# Patient Record
Sex: Male | Born: 1945 | Race: White | Hispanic: No | Marital: Married | Smoking: Never smoker
Health system: Southern US, Community
[De-identification: ages and names within clinical notes are randomized; demographics above are authoritative.]

## PROBLEM LIST (undated history)

## (undated) DIAGNOSIS — I10 Essential (primary) hypertension: Secondary | ICD-10-CM

## (undated) DIAGNOSIS — C229 Malignant neoplasm of liver, not specified as primary or secondary: Secondary | ICD-10-CM

## (undated) DIAGNOSIS — E785 Hyperlipidemia, unspecified: Secondary | ICD-10-CM

## (undated) DIAGNOSIS — E119 Type 2 diabetes mellitus without complications: Secondary | ICD-10-CM

## (undated) DIAGNOSIS — C801 Malignant (primary) neoplasm, unspecified: Secondary | ICD-10-CM

## (undated) HISTORY — DX: Malignant (primary) neoplasm, unspecified: C80.1

## (undated) HISTORY — DX: Hyperlipidemia, unspecified: E78.5

## (undated) HISTORY — PX: PROSTATE SURGERY: SHX751

## (undated) HISTORY — DX: Essential (primary) hypertension: I10

## (undated) HISTORY — DX: Malignant neoplasm of liver, not specified as primary or secondary: C22.9

## (undated) HISTORY — PX: EYE SURGERY: SHX253

## (undated) HISTORY — DX: Type 2 diabetes mellitus without complications: E11.9

## (undated) HISTORY — PX: LIVER BIOPSY: SHX301

---

## 2013-12-29 DIAGNOSIS — E559 Vitamin D deficiency, unspecified: Secondary | ICD-10-CM | POA: Insufficient documentation

## 2014-11-01 LAB — LIPID PANEL
LDL Cholesterol: 74 mg/dL
Triglycerides: 423 mg/dL — AB (ref 40–160)

## 2014-12-10 ENCOUNTER — Ambulatory Visit (INDEPENDENT_AMBULATORY_CARE_PROVIDER_SITE_OTHER): Payer: Medicare Other | Admitting: Emergency Medicine

## 2014-12-10 VITALS — BP 170/90 | HR 95 | Resp 16 | Ht 72.0 in | Wt 214.0 lb

## 2014-12-10 DIAGNOSIS — S01511A Laceration without foreign body of lip, initial encounter: Secondary | ICD-10-CM

## 2014-12-10 DIAGNOSIS — Z23 Encounter for immunization: Secondary | ICD-10-CM

## 2014-12-10 NOTE — Progress Notes (Signed)
[facility] [address], [place] [address] [phone]  Date:  12/10/2014   Name:  Victor Harrell   DOB:  [birth year]   MRN:  [id number]  PCP:  Raelene Bott, MD    Chief Complaint: Lip Laceration   History of Present Illness:  Victor Harrell is a 69 y.o. 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  Struck him in the lower lip Has a laceration with an apparent loss of skin and soft tissue Not current on TD Denies other complaint or health concern today.   There are no active problems to display for this patient.   Past Medical History  Diagnosis Date  . Cancer   . Diabetes mellitus without complication   . Hypertension     Past Surgical History  Procedure Laterality Date  . Eye surgery    . Prostate surgery      History  Substance Use Topics  . Smoking status: Never Smoker   . Smokeless tobacco: Not on file  . Alcohol Use: Not on file    History reviewed. No pertinent family history.  Not on File  Medication list has been reviewed and updated.  No current outpatient prescriptions on file prior to visit.   No current facility-administered medications on file prior to visit.    Review of Systems:  As per HPI, otherwise negative.    Physical Examination: Filed Vitals:   12/10/14 1250  BP: 170/90  Pulse: 95  Resp: 16   Filed Vitals:   12/10/14 1250  Height: 6' (1.829 m)  Weight: 214 lb (97.07 kg)   Body mass index is 29.02 kg/(m^2). Ideal Body Weight: Weight in (lb) to have BMI = 25: 183.9   GEN: WDWN, NAD, Non-toxic, Alert & Oriented x 3 HEENT: laceration lower lip 3 cm transverse with loss of soft tissue and skin.  Dentition and occlusion intact, Normocephalic.  Ears and Nose: No external deformity. EXTR: No clubbing/cyanosis/edema NEURO: Normal gait.  PSYCH: Normally interactive. Conversant. Not depressed or anxious appearing.  Calm demeanor.     Assessment and Plan: Complex laceration lip. Plastics TDAP  Signed,  Ellison Carwin, MD

## 2014-12-28 DIAGNOSIS — C61 Malignant neoplasm of prostate: Secondary | ICD-10-CM | POA: Insufficient documentation

## 2015-01-14 LAB — HM DIABETES EYE EXAM

## 2015-03-22 LAB — HEMOGLOBIN A1C: HEMOGLOBIN A1C: 9.5 % — AB (ref 4.0–6.0)

## 2015-04-12 ENCOUNTER — Encounter: Payer: Self-pay | Admitting: Endocrinology

## 2015-04-12 ENCOUNTER — Ambulatory Visit (INDEPENDENT_AMBULATORY_CARE_PROVIDER_SITE_OTHER): Payer: Medicare Other | Admitting: Endocrinology

## 2015-04-12 ENCOUNTER — Encounter: Payer: Medicare Other | Attending: Endocrinology | Admitting: Dietician

## 2015-04-12 ENCOUNTER — Encounter: Payer: Self-pay | Admitting: Dietician

## 2015-04-12 VITALS — Ht 71.0 in | Wt 211.0 lb

## 2015-04-12 VITALS — BP 160/80 | HR 77 | Temp 97.9°F | Resp 16 | Ht 72.0 in | Wt 211.8 lb

## 2015-04-12 DIAGNOSIS — E1165 Type 2 diabetes mellitus with hyperglycemia: Secondary | ICD-10-CM

## 2015-04-12 DIAGNOSIS — Z713 Dietary counseling and surveillance: Secondary | ICD-10-CM | POA: Insufficient documentation

## 2015-04-12 DIAGNOSIS — E785 Hyperlipidemia, unspecified: Secondary | ICD-10-CM

## 2015-04-12 DIAGNOSIS — E041 Nontoxic single thyroid nodule: Secondary | ICD-10-CM | POA: Diagnosis not present

## 2015-04-12 DIAGNOSIS — IMO0002 Reserved for concepts with insufficient information to code with codable children: Secondary | ICD-10-CM

## 2015-04-12 DIAGNOSIS — I10 Essential (primary) hypertension: Secondary | ICD-10-CM | POA: Diagnosis not present

## 2015-04-12 DIAGNOSIS — Z794 Long term (current) use of insulin: Secondary | ICD-10-CM | POA: Diagnosis not present

## 2015-04-12 LAB — POCT GLUCOSE (DEVICE FOR HOME USE): Glucose Fasting, POC: 296 mg/dL — AB (ref 70–99)

## 2015-04-12 LAB — MICROALBUMIN / CREATININE URINE RATIO
Creatinine,U: 55 mg/dL
Microalb Creat Ratio: 6.2 mg/g (ref 0.0–30.0)
Microalb, Ur: 3.4 mg/dL — ABNORMAL HIGH (ref 0.0–1.9)

## 2015-04-12 NOTE — Progress Notes (Signed)
Patient ID: Victor Harrell, male   DOB: 02-02-1946, 69 y.o.   MRN: [id number]           Reason for Appointment: Consultation for Type 2 Diabetes  Referring physician: None  History of Present Illness:          Date of diagnosis of type 2 diabetes mellitus : 47 + yrs       Background history:  He had been using oral agents like metformin and Amaryl for several years He is not sure if his sugars were controlled in the first several years but he does not think his blood sugar has been controlled for some time Victoza has been used for the last 2 years in addition and not sure if he has benefited from this.  He does not think it reduces his portion size or appetite He has lost some weight over the last few years  When he was started on treatment for his prostate cancer his blood sugars started going up especially with starting 10 mg prednisone; his A1c went up to 13.1 in 2/15, previously 9.6 in 2014 With this he was started on insulin and initially he took Lantus 10 units which was gradually increased Also started on Humalog, initially on a sliding scale  Recent history:   INSULIN regimen is described as:  28 Lantus at bedtime, Humalog 8 units twice a day      Because of persistently high blood sugars and A1c mostly over 9% he has come in today for further management 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  No recent weight loss  Current blood sugar patterns and problems identified:  His blood sugars are mildly increased fasting  He checks his blood sugars at suppertime  also and these are in the low 200s fairly consistently  Does not check any blood sugar at lunch or bedtime  He is taking his Humalog randomly about 30-45 minutes before meals at breakfast and supper  He says he is eating snacks throughout the day in addition to his meals     He takes 5 mg prednisone at breakfast and this may be causing higher readings Today he forgot his Humalog before breakfast although normally he takes this regularly and his glucose is 296  Oral hypoglycemic drugs the patient is taking are: Amaryl, metformin 1 g twice a day      Side effects from medications have been: None  Compliance with the medical regimen: Fair Hypoglycemia: None    Glucose monitoring:  done about 2 times a day         Glucometer: ?  .      Blood Glucose readings by time of day from home record  PREMEAL Breakfast Lunch Dinner Bedtime  Overall   Glucose range: 138-162   200+     Median:        Self-care: The diet that the patient has been following SN:[id number].    Typical meal intake: Breakfast is eggs and grits.  Usually avoiding drinks with sugar, drinking some milk when thirsty  Dietician visit, most recent: At diagnosis only               Exercise: none   Weight history: Previous maximum 248, recently stable  Wt Readings from Last 3 Encounters:  04/12/15 211 lb (95.709 kg)  04/12/15 211 lb 12.8 oz (96.072 kg)  12/10/14 214 lb (97.07 kg)    Glycemic control:   Lab Results  Component Value Date   HGBA1C 9.5* 03/22/2015   Lab Results  Component Value Date   LDLCALC 74 11/01/2014   Urine microalbumin ration normal in mid 2015      Medication List       This list is accurate as of: 04/12/15 12:12 PM.  Always use your most recent med list.               abiraterone Acetate 250 MG tablet  Commonly known as:  ZYTIGA  Take 1,000 mg by mouth daily. Take on an empty stomach 1 hour before or 2 hours after a meal     Amlodipine-Valsartan-HCTZ 5-160-12.5 MG Tabs    Take by mouth.     aspirin 81 MG tablet  Take 81 mg by mouth daily.     denosumab 120 MG/1.7ML Soln injection  Commonly known as:  XGEVA  Inject 120 mg into the skin every 30 (thirty) days.     glimepiride 4 MG tablet  Commonly known as:  AMARYL  Take 4 mg by mouth daily with breakfast.     Insulin Glargine 100 UNIT/ML Solostar Pen  Commonly known as:  LANTUS  Inject 28 Units into the skin daily at 10 pm.     insulin lispro 100 UNIT/ML injection  Commonly known as:  HUMALOG  Inject 8 Units into the skin 2 (two) times daily.     leuprolide 11.25 MG injection  Commonly known as:  LUPRON  Inject 11.25 mg into the muscle every 3 (three) months.     Liraglutide 18 MG/3ML Sopn  Inject 1.8 mg into the skin.     metFORMIN 1000 MG tablet  Commonly known as:  GLUCOPHAGE  Take 1,000 mg by mouth 2 (two) times daily with a meal.     predniSONE 5 MG tablet  Commonly known as:  DELTASONE  Take 5 mg by mouth daily with breakfast.     simvastatin 40 MG tablet  Commonly known as:  ZOCOR  Take 40 mg by mouth daily.     SM CINNAMON PO  Take by mouth.     Vitamin D3 5000 UNITS Caps  Take by mouth.        Allergies: No Known Allergies  Past Medical History  Diagnosis Date  . Diabetes mellitus without complication   . Hypertension   . Cancer     Prostate  . Hyperlipidemia     Past Surgical History  Procedure Laterality Date  . Eye surgery    . Prostate surgery      Family History  Problem Relation Age of Onset  . Diabetes Father     Social History:  reports that he has never smoked. He does not have any smokeless tobacco history on file. His alcohol and drug histories are not on file.    Review of Systems    Lipid history: He has been on Zocor for several years with good control of LDL but has had high triglycerides, previously 600+    Lab Results  Component Value Date   LDLCALC 74 11/01/2014   TRIG 423*  11/01/2014           Constitutional: no recent  weight gain/loss, no complaints of unusual fatigue   Eyes: no history of blurred vision.  Most recent eye exam was in 2/16 and did not show retinopathy  ENT: no  difficulty swallowing, no hoarseness.   Cardiovascular: no chest pain or tightness on exertion.  No leg swelling.  Hypertension: He has been taking amlodipine/valsartan for treatment  Respiratory: no cough/shortness of breath  Gastrointestinal: no change in bowel habits, nausea or abdominal pain  Musculoskeletal: no muscle/joint aches He has been taking vitamin D supplements for deficiency   Urological:   No excessive frequency of urination; he has nocturia once.  Has had incontinence since his prostate surgery Also has had erectile dysfunction  Skin: no rash or infections  Neurological: no headaches.  Has no numbness, burning, pains or tingling in feet    Psychiatric: no symptoms of depression  Endocrine: No unusual fatigue, cold intolerance or history of thyroid disease   LABS:  Office Visit on 04/12/2015  Component Date Value Ref Range Status  . Hgb A1c MFr Bld 03/22/2015 9.5* 4.0 - 6.0 % Final  . HM Diabetic Eye Exam 01/14/2015 No Retinopathy  No Retinopathy Final  . Glucose Fasting, POC 04/12/2015 296* 70 - 99 mg/dL Final  . Triglycerides 11/01/2014 423* 40 - 160 mg/dL Final  . LDL Cholesterol 11/01/2014 74   Final    Physical Examination:  BP 160/80 mmHg  Pulse 77  Temp(Src) 97.9 F (36.6 C)  Resp 16  Ht 6' (1.829 m)  Wt 211 lb 12.8 oz (96.072 kg)  BMI 28.72 kg/m2  SpO2 97%  GENERAL:         Patient has mild obesity.  has significant abdominal obesity  HEENT:         Eye exam shows normal external appearance. Fundus exam shows no retinopathy. Oral exam shows normal mucosa .  NECK:   There is no lymphadenopathy Thyroid is not enlarged but a 1.5-2 cm left thyroid nodule is felt, firm and smooth.  Right side is not palpable Carotids are normal to palpation and no bruit heard LUNGS:         Chest is  symmetrical. Lungs are clear to auscultation.Marland Kitchen   HEART:         Heart sounds:  S1 and S2 are normal. No murmurs or clicks heard., no S3 or S4.   ABDOMEN:   There is no distention present. Liver and spleen are not palpable. No other mass or tenderness present.   NEUROLOGICAL:   Vibration sense is moderately reduced in distal first toes. Ankle jerks are absent bilaterally.          Diabetic foot exam shows normal monofilament sensation in the toes and plantar surfaces, no skin lesions or ulcers on the feet and normal pedal pulses MUSCULOSKELETAL:  There is no swelling or deformity of the peripheral joints. Spine is normal to inspection.   EXTREMITIES:     There is no edema. No skin lesions present. SKIN:       No rash or lesions of concern.        ASSESSMENT:  Diabetes type 2, uncontrolled    He has had long-standing diabetes, recently requiring insulin Available records indicate that he has had poorly controlled diabetes since at least 2014 and most likely has been insulin deficient for quite some time Also has been inadequately motivated to take care of his diabetes or other aspects of  self-care Insulin regimen: Currently on bedtime Lantus and Humalog at breakfast and supper only with inadequate control and blood sugars are appearing to be consistently high in the afternoons either from lack of insulin coverage for his lunch or inadequate duration of action of Lantus; also may have some effect of prednisone taken the morning  Complications: None evident but needs to have urine microalbumin assessed  HYPERTENSION: Appears not well controlled with blood pressure high today, this has been followed by PCP and he will continue to follow-up there  HYPERLIPIDEMIA: LDL is adequately controlled but triglycerides have been high, partly from his hyperglycemia  Prostate cancer, currently being managed aggressively with multiple agents  THYROID nodule: He has a small nodule on the left side which has not  been previously evaluated.  Will need to evaluate this further with ultrasound Will also recheck TSH level  PLAN:   He will see the dietitian today for detailed meal planning  Check urine microalbumin  Start checking blood sugars after meals at different times by rotation as discussed  Change Lantus to the morning  Start taking Humalog consistently about 10-15 minutes before each meal  He will start taking at least 6 units of Humalog before lunch  He will check his blood sugars as directed at various times and fax the readings in one week to help adjust his Humalog doses  Encouraged him to start walking for exercise to help his insulin resistance and abdominal obesity  May consider changing Lantus to either twice a day or switch to Toujeo if his blood sugars are not consistently controlled in the morning  Stop glimepiride and may consider stopping Victoza as he may not be benefiting from this  Recheck lipids including fasting triglycerides when blood sugars are better  Forms filled out for [employer] for his job   Patient Instructions  Check blood sugars on waking up .Marland Kitchen 4-5 .Marland Kitchen times a week Also check blood sugars about 2-3 hours after a meal and do this after different meals by rotation  Recommended blood sugar levels on waking up is 90-130 and about 2 hours after meal is 140-180 Please bring blood sugar monitor to each visit.  Fax blood sugars in about a week to [phone]  Take humalog 5-10 min before meals.  Continue 8 units at breakfast and supper but start 6 units before lunch also If the blood sugars are consistently over 200 after any given meal may need to go up by 2 units for that meal.  LANTUS: Change to the morning before breakfast, may take with Humalog.  Continue same dose  Start regular walking program, 10-15 minutes a day  May stop GLIMEPIRIDE            Manasi Dishon 04/12/2015, 12:12 PM   Note: This office note was prepared with Merchant navy officer. Any transcriptional errors that result from this process are unintentional.

## 2015-04-12 NOTE — Progress Notes (Signed)
Medical Nutrition Therapy:  Appt start time: 0930 end time:  1000.   Assessment:  Primary concerns today: This is a walk in appointment.  Patient is here with his daughter and would like to improve his blood sugar.  A motivation for change is increased energy.  Patient with hx of type 2 Diabetes for >30 years.  HgbA1C 9.5% 03/22/15.  He states that this has decreased from 14.0%.   Hx also includes HTN and hyperlipidemia.  CBG's 150 in the am and 250 before dinner.  He does not exercise.  Weight was 248 lbs 2 years ago and has been stable at about 211 lbs for the past 2 years.  He reports that he remembers to take his medication almost every day.   Patient lives with wife and daughter.  Patient does most of the shopping.  Wife has a day care in their home and does not cook much.  They often eat out.  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    Preferred Learning Style:   No preference indicated   Learning Readiness:   Contemplating  MEDICATIONS: see list to include vitamin D, amaryl, lantus, humalog, glucophage, prednisone, and zocor   DIETARY INTAKE: Sometime I eat because I am bored.  Reports that he eats few sweets.  All beverages are carb free. 24-hr recall:  B ( AM): 2 eggs, 3 strips bacon, grits, 2 slices white wheat toast, milk or diet coke or coffee with splenda Snk ( AM): NABS and diet coke  L ( PM): chicken or hamburger and unsweetened tea Fast Food Snk ( PM): breakfast bar or NABS or nothing D ( PM):  Usually take out:  hamburger, chicken or BBQ with baked beans, potato salad Snk ( PM): cheerios (plain) with 2% milk Beverages: coffee with splenda, diet coke, unsweetened tea, water  Usual physical activity: none  Estimated energy needs: 2000 calories 225 g carbohydrates 125 g protein 67 g fat  Progress Towards Goal(s):  In progress.   Nutritional Diagnosis:  NB-1.1 Food and nutrition-related knowledge deficit As related to balance of carbohydrates, protein, and fats.  As  evidenced by diet hx.    Intervention:  Nutrition education/counseling regarding meal planning and dietary changes to improve insulin resistance.  Patient's current diet is high fat and snacks even when not hungry.  Discussed changing snacks to healthier options with small amount protein and only eating them if hungry.  Be mindful about eating out and choices made.  Try to prepare simple meals more often at home.  Calorie King app provided to help patient be aware of healthier meal choices when eating out.  Discussed decreasing bacon to occasional.  Discussed the importance of exercise and its role in blood sugar control.  Discussed limiting carbohydrate intake to about 60 grams per meal and 15 grams for snack only when hungry.    Be as active as possible.  Aim for 30 minutes 5 days per week. Bake, broil, grill rather than fried Consider splitting entry with others. Try to have vegetables at every dinner. Dinner ideas:  Quick and easy for home.  Vegetable Soup and [place] sandwich  Pinto beans with brown rice and vegetables  Grilled or baked chicken, baked sweet potato vegetables  1 cup pasta with very lean meat sauce or marinara, salad and steamed veges  Salad with grilled chicken, 2 slices Wheat bread  Brown rice, pinto beans, lettuce, tomato, salsa  Teaching Method Utilized:  Visual Auditory  Handouts given during visit  include:  My plate placemat  Meal plan card  Snack list  Take [place] for calorie king app  Barriers to learning/adherence to lifestyle change: time  Demonstrated degree of understanding via:  Teach Back   Monitoring/Evaluation:  Dietary intake, exercise, and body weight prn.

## 2015-04-12 NOTE — Patient Instructions (Addendum)
Check blood sugars on waking up .Marland Kitchen 4-5 .Marland Kitchen times a week Also check blood sugars about 2-3 hours after a meal and do this after different meals by rotation  Recommended blood sugar levels on waking up is 90-130 and about 2 hours after meal is 140-180 Please bring blood sugar monitor to each visit.  Fax blood sugars in about a week to [phone]  Take humalog 5-10 min before meals.  Continue 8 units at breakfast and supper but start 6 units before lunch also If the blood sugars are consistently over 200 after any given meal may need to go up by 2 units for that meal.  LANTUS: Change to the morning before breakfast, may take with Humalog.  Continue same dose  Start regular walking program, 10-15 minutes a day  May stop GLIMEPIRIDE

## 2015-04-12 NOTE — Patient Instructions (Addendum)
Be as active as possible.  Aim for 30 minutes 5 days per week. Bake, broil, grill rather than fried Consider splitting entry with others. Try to have vegetables at every dinner. Dinner ideas:  Quick and easy for home.  Vegetable Soup and [place] sandwich  Pinto beans with brown rice and vegetables  Grilled or baked chicken, baked sweet potato vegetables  1 cup pasta with very lean meat sauce or marinara, salad and steamed veges  Salad with grilled chicken, 2 slices Wheat bread  Brown rice, pinto beans, lettuce, tomato, salsa

## 2015-05-01 ENCOUNTER — Other Ambulatory Visit: Payer: Medicare Other

## 2015-05-08 ENCOUNTER — Encounter: Payer: Medicare Other | Attending: Endocrinology | Admitting: Nutrition

## 2015-05-08 ENCOUNTER — Encounter: Payer: Self-pay | Admitting: Endocrinology

## 2015-05-08 ENCOUNTER — Ambulatory Visit (INDEPENDENT_AMBULATORY_CARE_PROVIDER_SITE_OTHER): Payer: Medicare Other | Admitting: Endocrinology

## 2015-05-08 VITALS — BP 170/88 | HR 83 | Temp 97.8°F | Resp 16 | Ht 72.0 in | Wt 209.4 lb

## 2015-05-08 DIAGNOSIS — Z713 Dietary counseling and surveillance: Secondary | ICD-10-CM | POA: Diagnosis not present

## 2015-05-08 DIAGNOSIS — IMO0002 Reserved for concepts with insufficient information to code with codable children: Secondary | ICD-10-CM

## 2015-05-08 DIAGNOSIS — E1165 Type 2 diabetes mellitus with hyperglycemia: Secondary | ICD-10-CM

## 2015-05-08 DIAGNOSIS — Z794 Long term (current) use of insulin: Secondary | ICD-10-CM | POA: Insufficient documentation

## 2015-05-08 DIAGNOSIS — I10 Essential (primary) hypertension: Secondary | ICD-10-CM | POA: Diagnosis not present

## 2015-05-08 NOTE — Patient Instructions (Signed)
Fill and apply a new V-go every 24 hours Test blood sugars before meals and at bedtime, and call the results to the office on Monday

## 2015-05-08 NOTE — Progress Notes (Signed)
This patient was instructed on how to fill, apply and use the V-go.  He was given a starter kit of V-go 30s and he filled the V-go without any difficulty.  He did not apply it because he took his Lantus this AM. He was told to stop the Lantus today and to take no more.  Written instructions were given to take 4 clicks (8u) of insulin before all meals.  We discussed the need to adjust this dose by one click, based on the amount of carbs and current blood sugar reading.  He reported good understanding of this.  He had no final questions. He was told to test his blood sugars before meals and at bedtime and to call the readings to Dr. Ronnie Derby office on Monday.  He was given a 800 telephone number to call if questions between now and Monday.  He agreed to do this.

## 2015-05-08 NOTE — Progress Notes (Signed)
Patient ID: Victor Harrell, male   DOB: 1946-09-24, 69 y.o.   MRN: [id number]           Reason for Appointment: Follow-up for Type 2 Diabetes  Referring physician: None  History of Present Illness:          Date of diagnosis of type 2 diabetes mellitus : 71 + yrs       Background history:  He had been using oral agents including metformin and Amaryl for several years He is not sure if his sugars were controlled in the first several years but he does not think his blood sugar has been controlled for some time Victoza has been used since about 2014 in addition and not sure if he has benefited from this.  He does not think it reduces his portion size or appetite He has lost some weight over the last few years  When he was started on treatment for his prostate cancer his blood sugars started going up especially with starting 10 mg prednisone; his A1c went up to 13.1 in 2/15, previously 9.6 in 2014 With this he was started on insulin and initially he took Lantus 10 units which was gradually increased Also started on Humalog, initially on a sliding scale  Recent history:   INSULIN regimen is described as:  28 Lantus at bedtime, Humalog 10 units 3 a day      He was told to change his Lantus in the morning on his initial consultation because of relatively higher readings later in the day Also was told to start taking Humalog with his lunch which he was not doing previously He did bring in his glucose monitor today for review; blood sugars do not appear to be controlled Continues to take Victoza and metformin  Current blood sugar patterns and problems identified:  His blood sugars are consistently increased fasting, appear to be higher than before  He has not checked readings after meals especially after supper; however they seem to be generally higher around lunchtime and suppertime  This is despite his increasing his dose of Humalog to 10 units from 8 units a few days ago  Blood sugars  at suppertime are relatively better but he is checking infrequently He is taking his Humalog about 15 minutes before eating since his last visit  Oral hypoglycemic drugs the patient is taking are:  metformin 1 g twice a day      Side effects from medications have been: None  Compliance with the medical regimen: Fair Hypoglycemia: None    Glucose monitoring:  done about 2 times a day         Glucometer: ?  .      Blood Glucose readings by time of day from home record  Mean values apply above for all meters except median for One Touch  PRE-MEAL Fasting Lunch Dinner Bedtime Overall  Glucose range:  142-229   179-308     Mean/median:    239    197    POST-MEAL PC Breakfast PC Lunch PC Dinner  Glucose range:  197-281     Mean/median:        PREMEAL Breakfast Lunch Dinner Bedtime  Overall   Glucose range: 138-162   200+     Median:        Self-care: The diet that the patient has been following PO:EUMP.    Typical meal intake: Breakfast is eggs and grits.  Usually avoiding drinks with sugar, drinking some milk when thirsty  Dietician visit, most recent: At diagnosis only               Exercise: none   Weight history: Previous maximum 248, recently stable  Wt Readings from Last 3 Encounters:  05/08/15 209 lb 6.4 oz (94.983 kg)  04/12/15 211 lb (95.709 kg)  04/12/15 211 lb 12.8 oz (96.072 kg)    Glycemic control:   Lab Results  Component Value Date   HGBA1C 9.5* 03/22/2015   Lab Results  Component Value Date   MICROALBUR 3.4* 04/12/2015   LDLCALC 74 11/01/2014   Urine microalbumin ration normal in mid 2015      Medication List       This list is accurate as of: 05/08/15  1:00 PM.  Always use your most recent med list.               abiraterone Acetate 250 MG tablet  Commonly known as:  ZYTIGA  Take 1,000 mg by mouth daily. Take on an empty stomach 1 hour before or 2 hours after a meal     Amlodipine-Valsartan-HCTZ 5-160-12.5 MG Tabs  Take by  mouth.     aspirin 81 MG tablet  Take 81 mg by mouth daily.     denosumab 120 MG/1.7ML Soln injection  Commonly known as:  XGEVA  Inject 120 mg into the skin every 30 (thirty) days.     Insulin Glargine 100 UNIT/ML Solostar Pen  Commonly known as:  LANTUS  Inject 28 Units into the skin daily at 10 pm.     insulin lispro 100 UNIT/ML injection  Commonly known as:  HUMALOG  Inject 10 Units into the skin 2 (two) times daily.     leuprolide 11.25 MG injection  Commonly known as:  LUPRON  Inject 11.25 mg into the muscle every 3 (three) months.     Liraglutide 18 MG/3ML Sopn  Inject 1.8 mg into the skin.     metFORMIN 1000 MG tablet  Commonly known as:  GLUCOPHAGE  Take 1,000 mg by mouth 2 (two) times daily with a meal.     predniSONE 5 MG tablet  Commonly known as:  DELTASONE  Take 5 mg by mouth daily with breakfast.     simvastatin 40 MG tablet  Commonly known as:  ZOCOR  Take 40 mg by mouth daily.     SM CINNAMON PO  Take by mouth.     Vitamin D3 5000 UNITS Caps  Take by mouth.        Allergies: No Known Allergies  Past Medical History  Diagnosis Date  . Diabetes mellitus without complication   . Hypertension   . Cancer     Prostate  . Hyperlipidemia     Past Surgical History  Procedure Laterality Date  . Eye surgery    . Prostate surgery      Family History  Problem Relation Age of Onset  . Diabetes Father     Social History:  reports that he has never smoked. He does not have any smokeless tobacco history on file. His alcohol and drug histories are not on file.    Review of Systems    Lipid history: He has been on Zocor for several years with good control of LDL but has had high triglycerides, previously 600+    Lab Results  Component Value Date   LDLCALC 74 11/01/2014   TRIG 423* 11/01/2014          He had a thyroid nodule on  his exam as follows:1.5-2 cm left thyroid nodule but he did not go for an ultrasound as he was told by his  oncologist that his PET scan was normal  Most recent eye exam was in 2/16 and did not show retinopathy  Neurological: last foot exam was in 5/16    LABS:  No visits with results within 1 Week(s) from this visit. Latest known visit with results is:  Office Visit on 04/12/2015  Component Date Value Ref Range Status  . Hgb A1c MFr Bld 03/22/2015 9.5* 4.0 - 6.0 % Final  . HM Diabetic Eye Exam 01/14/2015 No Retinopathy  No Retinopathy Final  . Glucose Fasting, POC 04/12/2015 296* 70 - 99 mg/dL Final  . Microalb, Ur 04/12/2015 3.4* 0.0 - 1.9 mg/dL Final  . Creatinine,U 04/12/2015 55.0   Final  . Microalb Creat Ratio 04/12/2015 6.2  0.0 - 30.0 mg/g Final  . Triglycerides 11/01/2014 423* 40 - 160 mg/dL Final  . LDL Cholesterol 11/01/2014 74   Final    Physical Examination:  BP 170/88 mmHg  Pulse 83  Temp(Src) 97.8 F (36.6 C)  Resp 16  Ht 6' (1.829 m)  Wt 209 lb 6.4 oz (94.983 kg)  BMI 28.39 kg/m2  SpO2 97%        ASSESSMENT:  Diabetes type 2, uncontrolled    He has had long-standing diabetes and requiring insulin See history of present illness for detailed discussion of his current management, blood sugar patterns and problems identified He appears to be somewhat insulin resistant and not responding much to Victoza and metformin He appears to have inadequate duration of action of Lantus as with the morning dosage his fasting blood sugars have gone up Also postprandial readings are mostly high, not checking them regularly recently and none after supper  He is concerned about multiple insulin injections along with Victoza and continued poor control He was shown the V-go pump and described how this will be used and benefits compared to multiple injections and he is interested in this  THYROID nodule: He has a small nodule on the left side.  Will need to evaluate this further with ultrasound Discussed that he needs to have imaging done with an ultrasound and PET scan is not  specific for thyroid disease   PLAN:   He will see the nurse educator to try the V-go pump.  Instructions given, patient information brochures given, insurance verification sent  He will start with a 30 unit basal and use 8 units per meal  Encouraged him to start walking or other exercise program which he has not done  He can stop taking Victoza when current prescription is finished  Recheck lipids including fasting triglycerides when blood sugars are better   Patient Instructions  Lantus 32 units, dont take on am starting V-g0  Check blood sugars on waking up .Marland Kitchen4-5  .Marland Kitchen times a week Also check blood sugars about 2 hours after a meal and do this after different meals by rotation  Recommended blood sugar levels on waking up is 90-130 and about 2 hours after meal is 140-180 Please bring blood sugar monitor to each visit.  Walk in ams or midday   Counseling time on subjects discussed above is over 50% of today's 25 minute visit   Beena Catano 05/08/2015, 1:00 PM   Note: This office note was prepared with Estate agent. Any transcriptional errors that result from this process are unintentional.

## 2015-05-08 NOTE — Patient Instructions (Addendum)
Lantus 32 units, dont take on am starting V-g0  Check blood sugars on waking up .Marland Kitchen4-5  .Marland Kitchen times a week Also check blood sugars about 2 hours after a meal and do this after different meals by rotation  Recommended blood sugar levels on waking up is 90-130 and about 2 hours after meal is 140-180 Please bring blood sugar monitor to each visit.  Walk in ams or midday

## 2015-05-13 ENCOUNTER — Encounter: Payer: Self-pay | Admitting: Endocrinology

## 2015-05-13 ENCOUNTER — Other Ambulatory Visit: Payer: Self-pay

## 2015-05-13 ENCOUNTER — Ambulatory Visit (INDEPENDENT_AMBULATORY_CARE_PROVIDER_SITE_OTHER): Payer: Medicare Other | Admitting: Endocrinology

## 2015-05-13 ENCOUNTER — Other Ambulatory Visit: Payer: Self-pay | Admitting: *Deleted

## 2015-05-13 ENCOUNTER — Ambulatory Visit
Admission: RE | Admit: 2015-05-13 | Discharge: 2015-05-13 | Disposition: A | Discharge status: Home / Self Care | Payer: Medicare Other | Source: Ambulatory Visit | Attending: Endocrinology | Admitting: Endocrinology

## 2015-05-13 VITALS — BP 174/100 | HR 75 | Temp 98.2°F | Resp 16 | Ht 72.0 in | Wt 215.2 lb

## 2015-05-13 DIAGNOSIS — E785 Hyperlipidemia, unspecified: Secondary | ICD-10-CM | POA: Diagnosis not present

## 2015-05-13 DIAGNOSIS — E1165 Type 2 diabetes mellitus with hyperglycemia: Secondary | ICD-10-CM

## 2015-05-13 DIAGNOSIS — IMO0002 Reserved for concepts with insufficient information to code with codable children: Secondary | ICD-10-CM

## 2015-05-13 DIAGNOSIS — E041 Nontoxic single thyroid nodule: Secondary | ICD-10-CM | POA: Diagnosis not present

## 2015-05-13 MED ORDER — INSULIN LISPRO 100 UNIT/ML ~~LOC~~ SOLN: SUBCUTANEOUS | Status: DC

## 2015-05-13 MED ORDER — V-GO 40 KIT: PACK | Status: DC

## 2015-05-13 NOTE — Progress Notes (Signed)
Patient ID: Victor Harrell, male   DOB: [birth year], 69 y.o.   MRN: [id number]           Reason for Appointment: Follow-up for Type 2 Diabetes  Referring physician: None  History of Present Illness:          Date of diagnosis of type 2 diabetes mellitus : 22 + yrs       Background history:  He had been using oral agents including metformin and Amaryl for several years He is not sure if his sugars were controlled in the first several years but he does not think his blood sugar has been controlled for some time Victoza has been used since about 2014 in addition and not sure if he has benefited from this.  He does not think it reduces his portion size or appetite He has lost some weight over the last few years  When he was started on treatment for his prostate cancer his blood sugars started going up especially with starting 10 mg prednisone; his A1c went up to 13.1 in 2/15, previously 9.6 in 2014 With this he was started on insulin and initially he took Lantus 10 units which was gradually increased Also started on Humalog, initially on a sliding scale  Recent history:   INSULIN regimen is described as:  V-go 30 unit pump, boluses 8-12 units with meals    His previous regimen: 28 Lantus at bedtime, Humalog 10 units 3 a day      He was started on the V-go pump on 6/23 instead of the Humalog and Lantus because of poor control   He was also told to leave off his Victoza as he May not have been benefiting from it and was concerned about the cost.  He has been changing his pump in the morning at breakfast time.  Currently  still taking 5 mg prednisone. Continues to take metformin and Amaryl  Current blood sugar patterns and problems identified:  His blood sugars are consistently increased fasting, lowest readings 174 with starting the pump and previously as high as 229    His blood sugars are generally much higher by lunchtime and even without any increase in carbohydrate or fatty intake as  well as some activity his blood sugar may be over 300.   he has only a couple of readings in the afternoons and evenings and these are over 200  Oral hypoglycemic drugs the patient is taking are:  metformin 1 g twice a day , Amaryl      Side effects from medications have been: None  Compliance with the medical regimen: Fair Hypoglycemia: None    Glucose monitoring:  done about 2 times a day         Glucometer: ?  .      Blood Glucose readings by time of day from  [facility] for the last 5 days  Mean values apply above for all meters except median for One Touch  PRE-MEAL Fasting Lunch Dinner Bedtime Overall  Glucose range:  174- 206  284- 378  240    Mean/median:        POST-MEAL PC Breakfast PC Lunch PC Dinner  Glucose range:  280  267  202  Mean/median:         Self-care: The diet that the patient has been following is: small portions    Typical meal intake: Breakfast is eggs and grits or egg McMuffin.  Usually avoiding drinks with sugar, drinking some milk when thirsty  Dietician visit, most recent: At diagnosis only               Exercise: none   Weight history: Previous maximum 248, recently stable  Wt Readings from Last 3 Encounters:  05/13/15 215 lb 3.2 oz (97.614 kg)  05/08/15 209 lb 6.4 oz (94.983 kg)  04/12/15 211 lb (95.709 kg)    Glycemic control:   Lab Results  Component Value Date   HGBA1C 9.5* 03/22/2015   Lab Results  Component Value Date   MICROALBUR 3.4* 04/12/2015   LDLCALC 74 11/01/2014   Urine microalbumin ration normal in mid 2015      Medication List       This list is accurate as of: 05/13/15 10:08 AM.  Always use your most recent med list.               abiraterone Acetate 250 MG tablet  Commonly known as:  ZYTIGA  Take 1,000 mg by mouth daily. Take on an empty stomach 1 hour before or 2 hours after a meal     Amlodipine-Valsartan-HCTZ 5-160-12.5 MG Tabs  Take by mouth.     aspirin 81 MG tablet  Take 81 mg by  mouth daily.     denosumab 120 MG/1.7ML Soln injection  Commonly known as:  XGEVA  Inject 120 mg into the skin every 30 (thirty) days.     Insulin Glargine 100 UNIT/ML Solostar Pen  Commonly known as:  LANTUS  Inject 28 Units into the skin daily at 10 pm.     insulin lispro 100 UNIT/ML injection  Commonly known as:  HUMALOG  Inject 10 Units into the skin 2 (two) times daily.     leuprolide 11.25 MG injection  Commonly known as:  LUPRON  Inject 11.25 mg into the muscle every 3 (three) months.     Liraglutide 18 MG/3ML Sopn  Inject 1.8 mg into the skin.     metFORMIN 1000 MG tablet  Commonly known as:  GLUCOPHAGE  Take 1,000 mg by mouth 2 (two) times daily with a meal.     predniSONE 5 MG tablet  Commonly known as:  DELTASONE  Take 5 mg by mouth daily with breakfast.     simvastatin 40 MG tablet  Commonly known as:  ZOCOR  Take 40 mg by mouth daily.     SM CINNAMON PO  Take by mouth.     Vitamin D3 5000 UNITS Caps  Take by mouth.        Allergies: No Known Allergies  Past Medical History  Diagnosis Date  . Diabetes mellitus without complication   . Hypertension   . Cancer     Prostate  . Hyperlipidemia     Past Surgical History  Procedure Laterality Date  . Eye surgery    . Prostate surgery      Family History  Problem Relation Age of Onset  . Diabetes Father     Social History:  reports that he has never smoked. He does not have any smokeless tobacco history on file. His alcohol and drug histories are not on file.    Review of Systems    Lipid history: He has been on Zocor for several years with good control of LDL but has had high triglycerides, previously 600+    Lab Results  Component Value Date   LDLCALC 74 11/01/2014   TRIG 423* 11/01/2014          He had a thyroid nodule on his  exam as follows:1.5-2 cm left thyroid nodule but he did not go for an ultrasound as he was told by his oncologist that his PET scan was normal  Most recent  eye exam was in 2/16 and did not show retinopathy  Neurological: last foot exam was in 5/16   He is going to get a liver biopsy for an abnormal lesion on PET scan  LABS:  No visits with results within 1 Week(s) from this visit. Latest known visit with results is:  Office Visit on 04/12/2015  Component Date Value Ref Range Status  . Hgb A1c MFr Bld 03/22/2015 9.5* 4.0 - 6.0 % Final  . HM Diabetic Eye Exam 01/14/2015 No Retinopathy  No Retinopathy Final  . Glucose Fasting, POC 04/12/2015 296* 70 - 99 mg/dL Final  . Microalb, Ur 04/12/2015 3.4* 0.0 - 1.9 mg/dL Final  . Creatinine,U 04/12/2015 55.0   Final  . Microalb Creat Ratio 04/12/2015 6.2  0.0 - 30.0 mg/g Final  . Triglycerides 11/01/2014 423* 40 - 160 mg/dL Final  . LDL Cholesterol 11/01/2014 74   Final    Physical Examination:  BP 174/100 mmHg  Pulse 75  Temp(Src) 98.2 F (36.8 C)  Resp 16  Ht 6' (1.829 m)  Wt 215 lb 3.2 oz (97.614 kg)  BMI 29.18 kg/m2  SpO2 97%        ASSESSMENT:  Diabetes type 2, uncontrolled    He has had long-standing diabetes and requiring insulin See history of present illness for detailed discussion of his current management, blood sugar patterns and problems identified He still has high readings with starting the V-go pump , 30 unit basal even though he was taking  His 28 units Lantus dose previously. His blood sugars are also high after meals even with taking up to 12 units bolus.  His blood sugar may be higher with stopping the Victoza. Also taking small amount of prednisone and blood sugars are the highest before lunch.  He thinks he is doing fairly well with his diet and is trying to be as active as possible  He does want to continue using the pump for the convenience  THYROID nodule: He has a small nodule on the left side.  He has an ultrasound scheduled today.  PLAN:   He will try the 40 unit pump, given sample and also will send a prescription  Increase mealtime coverage to up  to 14 units per meal.  He may need to use supplemental Humalog injections to cover his meals  He will take at least 12 units for breakfast as blood sugars tend to be highest at lunchtime possibly from prednisone  Retry Victoza 1.2 mg daily since this may help his control also  Encouraged him to start walking or other exercise  Recheck lipids including fasting triglycerides when blood sugars are better  Follow-up in 3 weeks  Review ultrasound when available of the thyroid   Patient Instructions  Check blood sugars on waking up .Marland Kitchen5-6  .Marland Kitchen times a week Also check blood sugars about 2 hours after a meal and do this after different meals by rotation  Recommended blood sugar levels on waking up is 90-130 and about 2 hours after meal is 140-180 Please bring blood sugar monitor to each visit.  Victoza 1.2mg  daily  Take 5-7 clicks at meals    Counseling time on subjects discussed above is over 50% of today's 25 minute visit   Aerielle Stoklosa 05/13/2015, 10:08 AM   Note: This office note  was prepared with Dragon voice recognition system technology. Any transcriptional errors that result from this process are unintentional.  

## 2015-05-13 NOTE — Patient Instructions (Addendum)
Check blood sugars on waking up .Marland Kitchen5-6  .Marland Kitchen times a week Also check blood sugars about 2 hours after a meal and do this after different meals by rotation  Recommended blood sugar levels on waking up is 90-130 and about 2 hours after meal is 140-180 Please bring blood sugar monitor to each visit.  Victoza 1.2mg  daily  Take 5-7 clicks at meals

## 2015-05-16 ENCOUNTER — Other Ambulatory Visit: Payer: Self-pay

## 2015-05-16 ENCOUNTER — Telehealth: Payer: Self-pay | Admitting: Endocrinology

## 2015-05-16 MED ORDER — INSULIN LISPRO 100 UNIT/ML ~~LOC~~ SOLN: SUBCUTANEOUS | Status: AC

## 2015-05-16 NOTE — Telephone Encounter (Signed)
He only requested needles

## 2015-05-16 NOTE — Telephone Encounter (Signed)
Did he say he needed the kit? The needles aren't sold sep.

## 2015-05-16 NOTE — Telephone Encounter (Signed)
Pt needs rx for needles for vgo [facility]

## 2015-05-17 ENCOUNTER — Telehealth: Payer: Self-pay | Admitting: Family Medicine

## 2015-05-17 NOTE — Telephone Encounter (Signed)
Received call that patient was going to be out of insulin by Saturday by [facility].  Pharmacy does not carry v-go insulin pumps apparently that were to be used.   After several phone calls, discovered patient has enough of prior insulin to dose themselves through Tuesday and will return to endocrine office at that time to figure out how to obtain v-go pumps.

## 2015-05-21 ENCOUNTER — Telehealth: Payer: Self-pay | Admitting: Endocrinology

## 2015-05-21 ENCOUNTER — Other Ambulatory Visit: Payer: Self-pay | Admitting: *Deleted

## 2015-05-21 MED ORDER — V-GO 40 KIT: PACK | Status: AC

## 2015-05-21 NOTE — Telephone Encounter (Signed)
Pt called and will be getting v-go pump by tomorrow evening at 5pm

## 2015-05-21 NOTE — Telephone Encounter (Signed)
Pt needs v go 40 please send in a rx however the pt is calling now to find where. They will call back until then can we give sample? Please advise [facility] note dated 05/17/15 at 5:54 pm: Chief Complaint Prescription Refill or Medication Request (non symptomatic) Initial Comment Caller States her husband just went to pick up his script and it is the wrong medication, it should have been a patch Additional Documentation ---Caller States her husband just went to pick up his script and it is the wrong medication, it should have been a patch. V- go pump attaches to stomach. He runs out tomorrow. He was supposed to get a 30 day supply. Disposable insulin delivery. A bottle of insulin was called in. 40 units 24 hours. Insulin is humalog- was at the pharmacy but not the pump to deliver it. Marland Kitchen He puts it on every morning. Refills it every day. Continuous infusion. [phone]. [facility] [place] [facility]. Family will be out of meds after Monday and will need a new box of pumps on Tueday morning early.

## 2015-05-23 DIAGNOSIS — C221 Intrahepatic bile duct carcinoma: Secondary | ICD-10-CM

## 2015-05-23 DIAGNOSIS — C249 Malignant neoplasm of biliary tract, unspecified: Secondary | ICD-10-CM | POA: Insufficient documentation

## 2015-06-06 ENCOUNTER — Ambulatory Visit (INDEPENDENT_AMBULATORY_CARE_PROVIDER_SITE_OTHER): Payer: Medicare Other | Admitting: Physician Assistant

## 2015-06-06 VITALS — BP 188/80 | HR 84 | Temp 98.5°F | Resp 16 | Ht 72.0 in | Wt 211.4 lb

## 2015-06-06 DIAGNOSIS — F411 Generalized anxiety disorder: Secondary | ICD-10-CM

## 2015-06-06 DIAGNOSIS — I10 Essential (primary) hypertension: Secondary | ICD-10-CM | POA: Diagnosis not present

## 2015-06-06 MED ORDER — CLONAZEPAM 0.5 MG PO TABS: 0.5000 mg | ORAL_TABLET | Freq: Two times a day (BID) | ORAL | Status: AC | PRN

## 2015-06-06 MED ORDER — AMLODIPINE-VALSARTAN-HCTZ 5-160-25 MG PO TABS: 1.0000 | ORAL_TABLET | Freq: Every day | ORAL | Status: AC

## 2015-06-06 NOTE — Progress Notes (Signed)
   Subjective:    Patient ID: Victor Harrell, male    DOB: 1946-08-30, 69 y.o.   MRN: [id number]  Chief Complaint  Patient presents with  . Hypertension   Patient Active Problem List   Diagnosis Date Noted  . CA of prostate 06/07/2015  . 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 Type II diabetes mellitus, uncontrolled 04/12/2015  . Essential hypertension, benign 04/12/2015  . Hyperlipidemia 04/12/2015  . Primary malignant neoplasm of prostate 12/28/2014  . Avitaminosis D 12/29/2013   Medications, allergies, past medical history, surgical history, family history, social history and problem list reviewed and updated.  HPI  43 yom with pmh prostate ca and recently diagnosed liver ca presents with poorly controlled bp.   Has been on exforge hct (amlodipine-valsartan-hctz 5-160-12.5) for several yrs. Checks his bp at home typically runs 140s/70s. Since his recent liver ca diagnosis his bp has been running in 170-180s. BP here last month was 174/100, today is 188.80. Pt admits to increased anxiety and stress over the recent diagnosis and thinks this is contributing to the bp. Wife and daughter with pt who attest to this and both state they have started medication recently to help with the stress. They inquire about starting low dose anxiety medication to help with this transition and with hopes it may help the bp.   Denies regular nsaid usage. Pt and wife who sleeps in same bed deny sleep apnea sx. He does take steroids from his oncologist but at 5 mg dose every other day. Not very active recently due to life events.   Both of his oncologists are at [place].   Review of Systems Denies cp, sob, persistent ha, vision changes. Denies lower extremity edema. Denies weak urinary stream, straining, or freq urination.     Objective:   Physical Exam  Constitutional: He is oriented to person, place, and time. He appears well-developed and well-nourished.  Non-toxic appearance. He does not have a sickly  appearance. He does not appear ill. No distress.  BP 188/80 mmHg  Pulse 84  Temp(Src) 98.5 F (36.9 C) (Oral)  Resp 16  Ht 6' (1.829 m)  Wt 211 lb 6 oz (95.879 kg)  BMI 28.66 kg/m2  SpO2 97%   Neurological: He is alert and oriented to person, place, and time.  Psychiatric: He has a normal mood and affect. His speech is normal and behavior is normal.      Assessment & Plan:   Essential hypertension - Plan: Amlodipine-Valsartan-HCTZ 5-160-25 MG TABS  Anxiety state - Plan: clonazePAM (KLONOPIN) 0.5 MG tablet --increased exforge hct from 5-160-12.5 to 5-160-25 (increased hctz portion) -- Scr and k both wnl from 05/23/15 [facility] --pt denies urinary sx despite prosate ca hx  --encouraged checking bp at home and recording, f/u with pcp for further bp management if consistently running >140/90 --suspect increased anxiety and worry may be playing role as bp has recently bumped up since liver ca diagnosis --start klonopin 0.5 mg bid prn  Julieta Gutting, PA-C Physician Assistant-Certified [facility]  06/07/2015 2:02 PM

## 2015-06-06 NOTE — Patient Instructions (Signed)
Please take the new BP medication once daily. Please take the klonopin up to twice daily as needed when feeling anxious.  Be sure to record your bp readings at home and bring them with you to your appointments with your pcp. Ideally you are running below 140/90 consistently.

## 2015-06-07 ENCOUNTER — Encounter: Payer: Self-pay | Admitting: Physician Assistant

## 2015-06-07 DIAGNOSIS — C61 Malignant neoplasm of prostate: Secondary | ICD-10-CM | POA: Insufficient documentation

## 2015-06-12 ENCOUNTER — Ambulatory Visit: Payer: Medicare Other | Admitting: Endocrinology

## 2015-07-19 ENCOUNTER — Ambulatory Visit: Payer: Medicare Other | Admitting: Endocrinology

## 2015-08-17 DEATH — deceased

## 2015-09-05 IMAGING — US US SOFT TISSUE HEAD/NECK
1 series · 13 of 25 positions shown · non-contrast
Comparison: None.

CLINICAL DATA: 69-year-old male with possible thyroid nodules felt
on physical examination.

EXAM:
THYROID ULTRASOUND
TECHNIQUE: Ultrasound examination of the thyroid gland and adjacent soft
tissues was performed.

[Series 1: us soft tissue head/neck · 0.08mm/px · 13 of 44 slices shown]
[im 1/44]
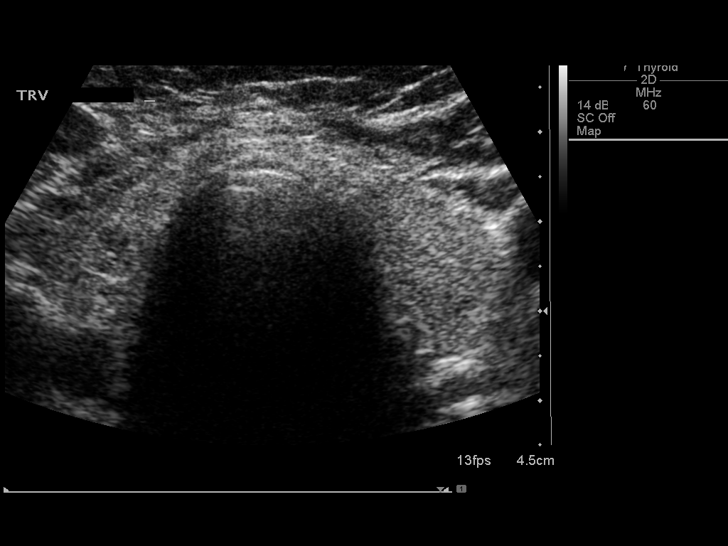
[im 4/44]
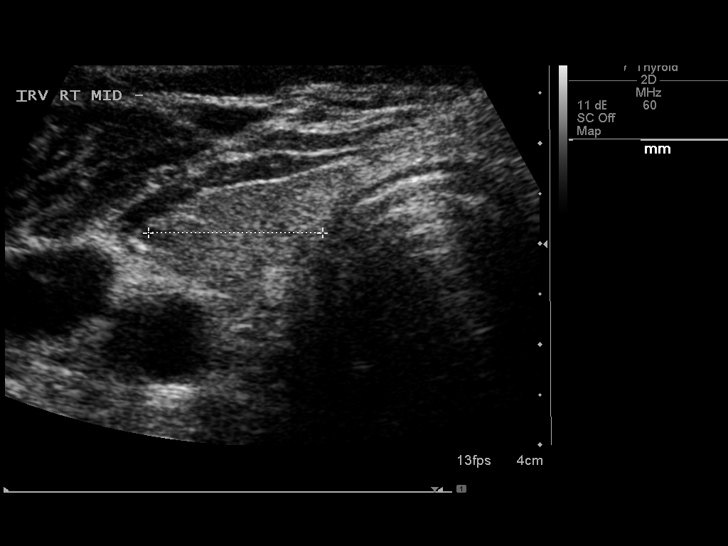
[im 8/44]
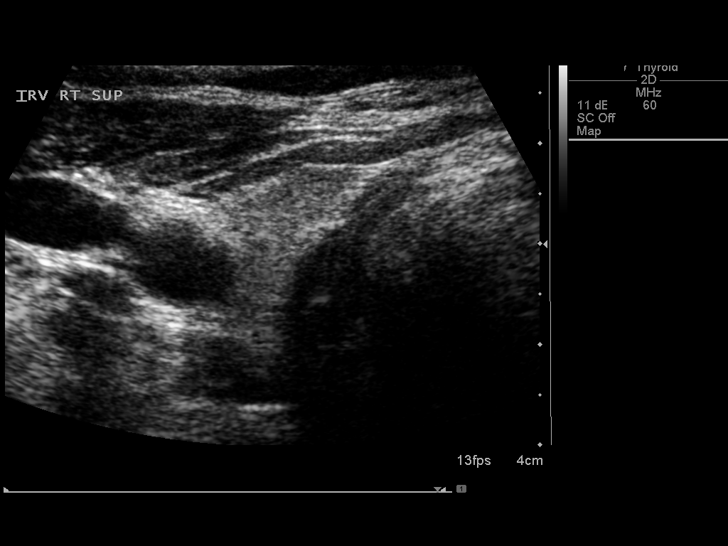
[im 11/44]
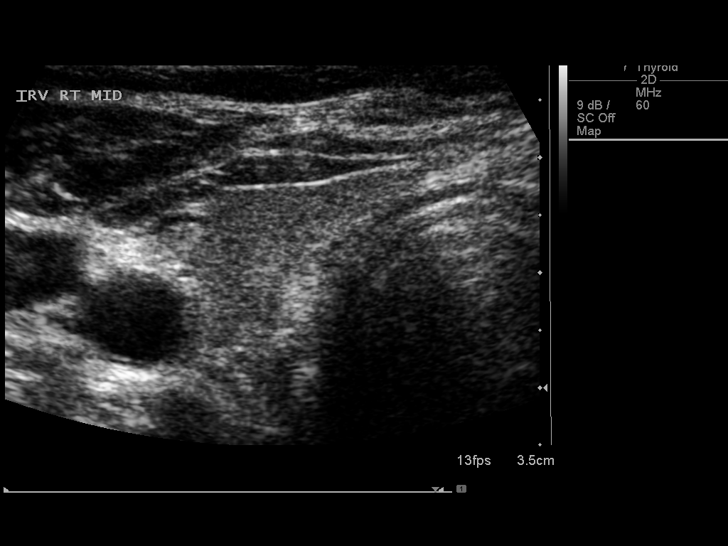
[im 15/44]
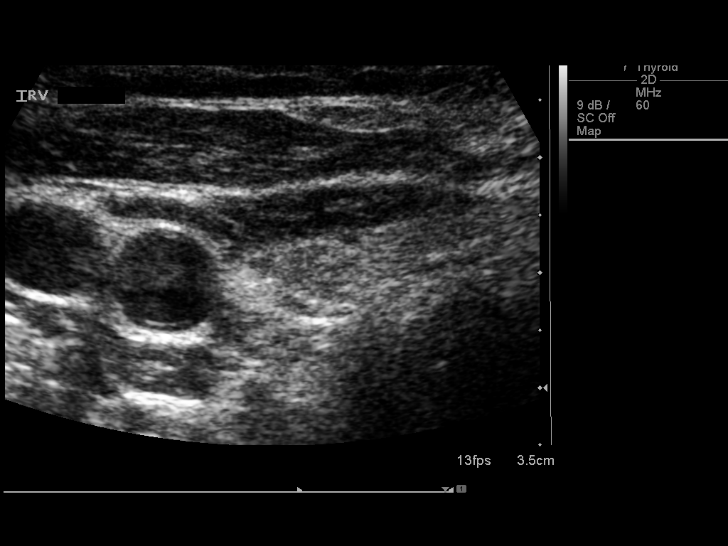
[im 18/44]
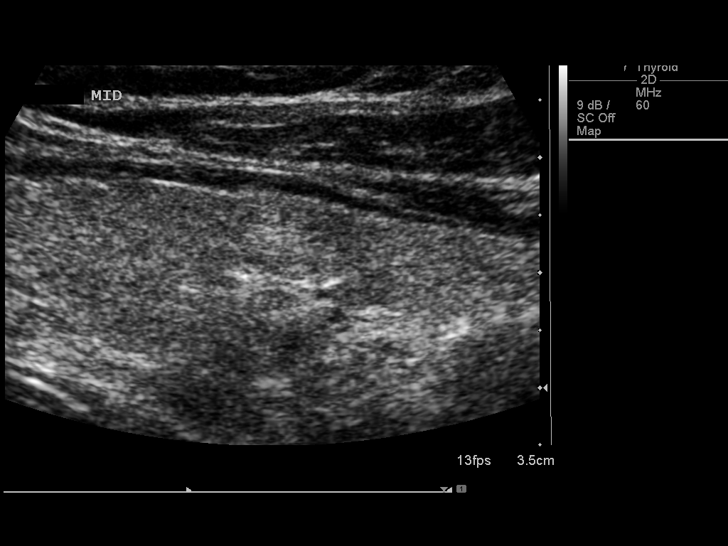
[im 22/44]
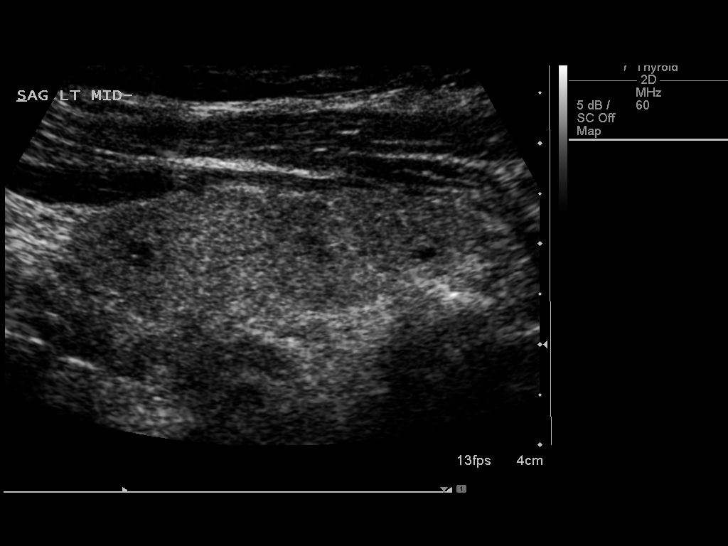
[im 26/44]
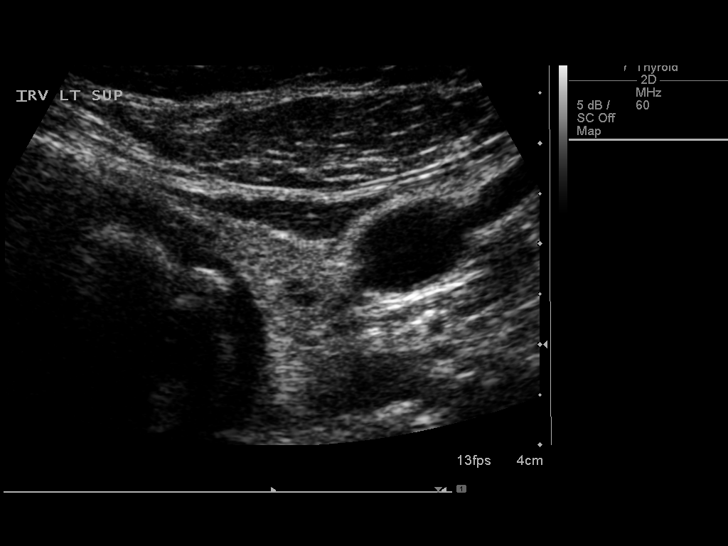
[im 29/44]
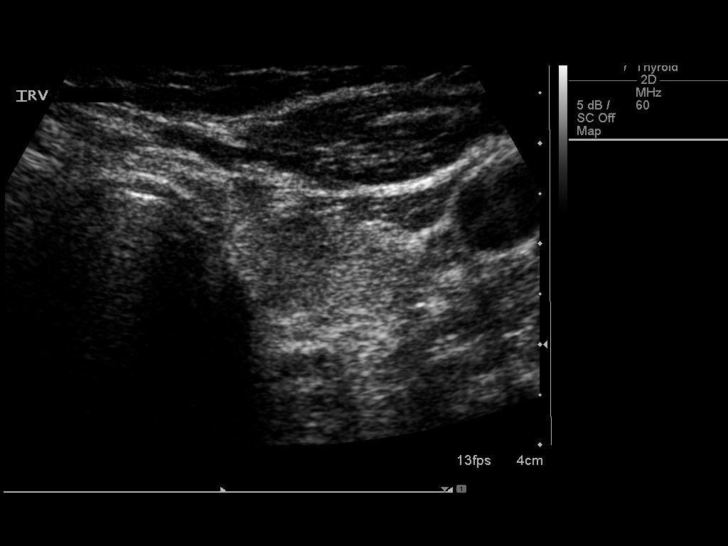
[im 33/44]
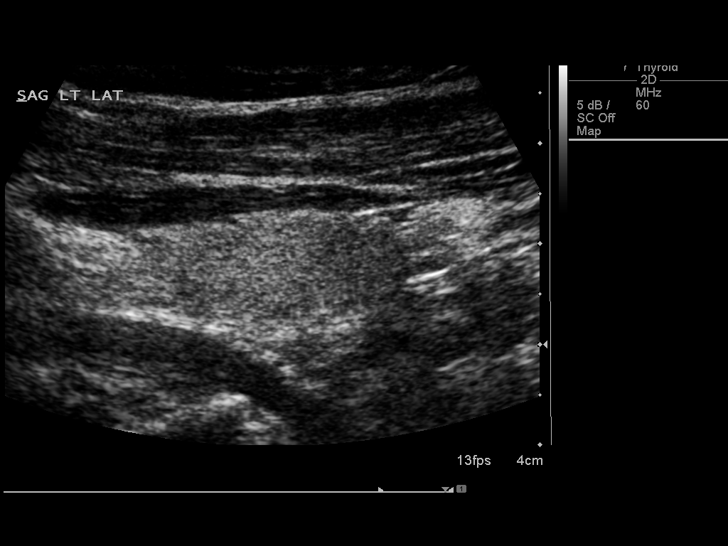
[im 36/44]
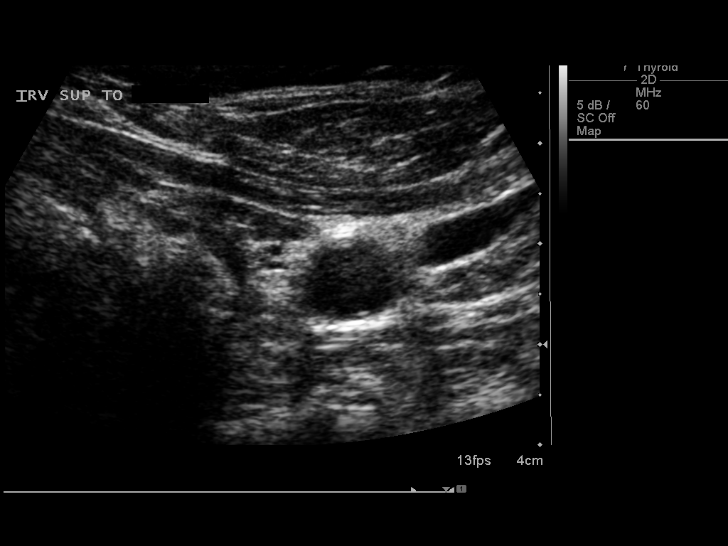
[im 40/44]
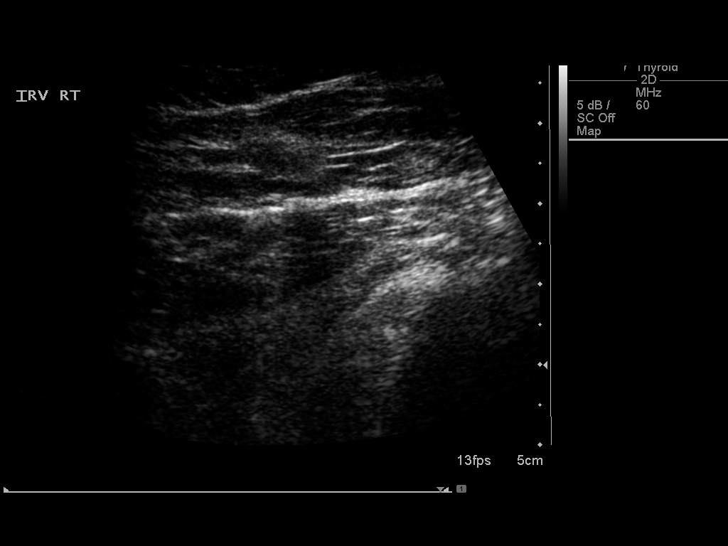
[im 44/44]
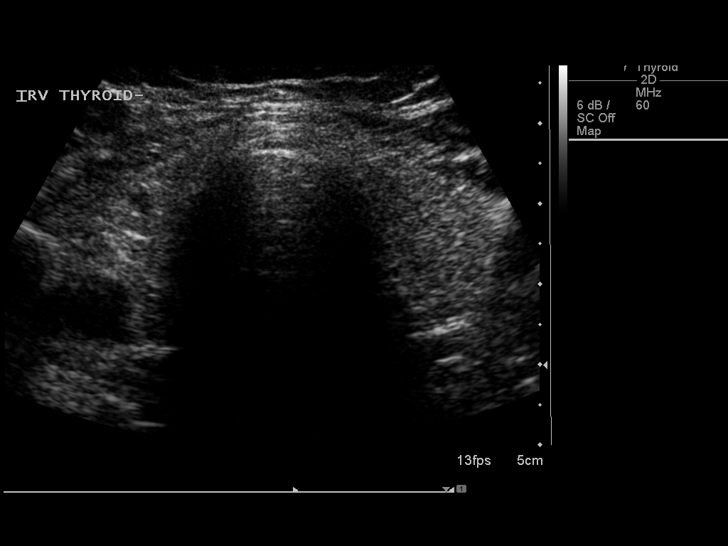

[13 of 25 positions shown; findings below may reference images not displayed]

FINDINGS: Right thyroid lobe

Measurements: 5.0 x 1.5 x 1.7 cm. Diffusely heterogeneous and
slightly lobular thyroid gland. Areas of heterogeneity representing
either foci of focal thyroiditis, true nodules or pseudo nodules are
noted in the mid and lower gland measuring 4 and 7 mm respectively.

Left thyroid lobe

Measurements: 4.2 x 1.4 x 1.8 cm. Diffusely heterogeneous and
slightly lobular thyroid gland. 3 small hypoechoic solid nodules are
present scattered throughout the gland measuring 2, 3 and 6 mm
respectively.

Isthmus

Thickness: 0.5 cm.  No nodules visualized.

Lymphadenopathy

None visualized.
IMPRESSION: Multiple small (less than 1 cm) thyroid nodules bilaterally.

The background thyroid parenchyma is diffusely heterogeneous and
slightly lobular.

Findings do not meet current SRU consensus criteria for biopsy.
Follow-up by clinical exam is recommended. If patient has known risk
factors for thyroid carcinoma, consider follow-up ultrasound in 12
months. If patient is clinically hyperthyroid, consider nuclear
medicine thyroid uptake and scan.

Reference: Management of Thyroid Nodules Detected at US: Society of
Radiologists in Ultrasound Consensus Conference Statement. Radiology
